# Patient Record
Sex: Male | Born: 1935 | Race: White | Hispanic: No | Marital: Married | Smoking: Never smoker
Health system: Southern US, Community
[De-identification: ages and names within clinical notes are randomized; demographics above are authoritative.]

## PROBLEM LIST (undated history)

## (undated) DIAGNOSIS — I2510 Atherosclerotic heart disease of native coronary artery without angina pectoris: Secondary | ICD-10-CM

## (undated) DIAGNOSIS — E785 Hyperlipidemia, unspecified: Secondary | ICD-10-CM

## (undated) DIAGNOSIS — I10 Essential (primary) hypertension: Secondary | ICD-10-CM

## (undated) DIAGNOSIS — E119 Type 2 diabetes mellitus without complications: Secondary | ICD-10-CM

## (undated) HISTORY — DX: Essential (primary) hypertension: I10

## (undated) HISTORY — DX: Hyperlipidemia, unspecified: E78.5

## (undated) HISTORY — DX: Type 2 diabetes mellitus without complications: E11.9

## (undated) HISTORY — PX: CORONARY ARTERY BYPASS GRAFT: SHX141

## (undated) HISTORY — PX: APPENDECTOMY: SHX54

## (undated) HISTORY — PX: TOTAL KNEE ARTHROPLASTY: SHX125

## (undated) HISTORY — DX: Atherosclerotic heart disease of native coronary artery without angina pectoris: I25.10

---

## 2004-01-17 ENCOUNTER — Inpatient Hospital Stay (HOSPITAL_COMMUNITY): Admission: RE | Admit: 2004-01-17 | Discharge: 2004-01-20 | Payer: Self-pay | Admitting: Orthopedic Surgery

## 2004-01-20 ENCOUNTER — Inpatient Hospital Stay
Admission: RE | Admit: 2004-01-20 | Discharge: 2004-01-24 | Payer: Self-pay | Admitting: Physical Medicine & Rehabilitation

## 2004-01-20 ENCOUNTER — Ambulatory Visit: Payer: Self-pay | Admitting: Physical Medicine & Rehabilitation

## 2005-03-21 ENCOUNTER — Other Ambulatory Visit: Admission: RE | Admit: 2005-03-21 | Discharge: 2005-03-21 | Payer: Self-pay | Admitting: Unknown Physician Specialty

## 2008-03-07 ENCOUNTER — Inpatient Hospital Stay (HOSPITAL_COMMUNITY): Admission: RE | Admit: 2008-03-07 | Discharge: 2008-03-10 | Payer: Self-pay | Admitting: Orthopedic Surgery

## 2008-05-09 ENCOUNTER — Ambulatory Visit (HOSPITAL_BASED_OUTPATIENT_CLINIC_OR_DEPARTMENT_OTHER): Admission: RE | Admit: 2008-05-09 | Discharge: 2008-05-09 | Payer: Self-pay | Admitting: Urology

## 2010-06-14 LAB — BASIC METABOLIC PANEL
BUN: 15 mg/dL (ref 6–23)
CO2: 27 mEq/L (ref 19–32)
Calcium: 8.7 mg/dL (ref 8.4–10.5)
Chloride: 104 mEq/L (ref 96–112)
Creatinine, Ser: 0.73 mg/dL (ref 0.4–1.5)
GFR calc Af Amer: >60 mL/min (ref >60)
GFR calc non Af Amer: >60 mL/min (ref >60)
Glucose, Bld: 193 mg/dL — ABNORMAL HIGH (ref 70–99)
Potassium: 4 mEq/L (ref 3.5–5.1)
Sodium: 136 mEq/L (ref 135–145)

## 2010-06-14 LAB — CBC
HCT: 37.3 % — ABNORMAL LOW (ref 39.0–52.0)
Hemoglobin: 12.4 g/dL — ABNORMAL LOW (ref 13.0–17.0)
MCHC: 33.1 g/dL (ref 30.0–36.0)
MCV: 89.4 fL (ref 78.0–100.0)
Platelets: 193 10*3/uL (ref 150–400)
RBC: 4.18 MIL/uL — ABNORMAL LOW (ref 4.22–5.81)
RDW: 15.7 % — ABNORMAL HIGH (ref 11.5–15.5)
WBC: 8 10*3/uL (ref 4.0–10.5)

## 2010-06-14 LAB — GLUCOSE, CAPILLARY: Glucose-Capillary: 175 mg/dL — ABNORMAL HIGH (ref 70–99)

## 2010-06-18 LAB — CBC
HCT: 33.9 % — ABNORMAL LOW (ref 39.0–52.0)
HCT: 36.5 % — ABNORMAL LOW (ref 39.0–52.0)
Hemoglobin: 11.5 g/dL — ABNORMAL LOW (ref 13.0–17.0)
Hemoglobin: 11.7 g/dL — ABNORMAL LOW (ref 13.0–17.0)
Hemoglobin: 11.9 g/dL — ABNORMAL LOW (ref 13.0–17.0)
MCHC: 32.7 g/dL (ref 30.0–36.0)
MCHC: 33.5 g/dL (ref 30.0–36.0)
MCHC: 33.9 g/dL (ref 30.0–36.0)
MCV: 89.6 fL (ref 78.0–100.0)
MCV: 90.1 fL (ref 78.0–100.0)
MCV: 91 fL (ref 78.0–100.0)
Platelets: 130 10*3/uL — ABNORMAL LOW (ref 150–400)
Platelets: 144 10*3/uL — ABNORMAL LOW (ref 150–400)
RBC: 3.77 MIL/uL — ABNORMAL LOW (ref 4.22–5.81)
RBC: 4.01 MIL/uL — ABNORMAL LOW (ref 4.22–5.81)
RDW: 15.3 % (ref 11.5–15.5)
RDW: 15.4 % (ref 11.5–15.5)
RDW: 16 % — ABNORMAL HIGH (ref 11.5–15.5)
WBC: 11.3 10*3/uL — ABNORMAL HIGH (ref 4.0–10.5)
WBC: 12.1 10*3/uL — ABNORMAL HIGH (ref 4.0–10.5)

## 2010-06-18 LAB — GLUCOSE, CAPILLARY
Glucose-Capillary: 168 mg/dL — ABNORMAL HIGH (ref 70–99)
Glucose-Capillary: 185 mg/dL — ABNORMAL HIGH (ref 70–99)
Glucose-Capillary: 211 mg/dL — ABNORMAL HIGH (ref 70–99)
Glucose-Capillary: 219 mg/dL — ABNORMAL HIGH (ref 70–99)
Glucose-Capillary: 235 mg/dL — ABNORMAL HIGH (ref 70–99)
Glucose-Capillary: 244 mg/dL — ABNORMAL HIGH (ref 70–99)
Glucose-Capillary: 245 mg/dL — ABNORMAL HIGH (ref 70–99)
Glucose-Capillary: 246 mg/dL — ABNORMAL HIGH (ref 70–99)
Glucose-Capillary: 250 mg/dL — ABNORMAL HIGH (ref 70–99)
Glucose-Capillary: 250 mg/dL — ABNORMAL HIGH (ref 70–99)
Glucose-Capillary: 287 mg/dL — ABNORMAL HIGH (ref 70–99)

## 2010-06-18 LAB — BASIC METABOLIC PANEL
BUN: 11 mg/dL (ref 6–23)
BUN: 11 mg/dL (ref 6–23)
BUN: 8 mg/dL (ref 6–23)
CO2: 27 mEq/L (ref 19–32)
CO2: 28 mEq/L (ref 19–32)
CO2: 32 mEq/L (ref 19–32)
Calcium: 7.8 mg/dL — ABNORMAL LOW (ref 8.4–10.5)
Calcium: 8 mg/dL — ABNORMAL LOW (ref 8.4–10.5)
Calcium: 8.2 mg/dL — ABNORMAL LOW (ref 8.4–10.5)
Chloride: 101 mEq/L (ref 96–112)
Chloride: 104 mEq/L (ref 96–112)
Chloride: 98 mEq/L (ref 96–112)
Creatinine, Ser: 0.69 mg/dL (ref 0.4–1.5)
Creatinine, Ser: 0.77 mg/dL (ref 0.4–1.5)
Creatinine, Ser: 0.82 mg/dL (ref 0.4–1.5)
GFR calc Af Amer: >60 mL/min (ref >60)
GFR calc Af Amer: >60 mL/min (ref >60)
GFR calc Af Amer: >60 mL/min (ref >60)
GFR calc non Af Amer: >60 mL/min (ref >60)
GFR calc non Af Amer: >60 mL/min (ref >60)
GFR calc non Af Amer: >60 mL/min (ref >60)
Glucose, Bld: 187 mg/dL — ABNORMAL HIGH (ref 70–99)
Glucose, Bld: 239 mg/dL — ABNORMAL HIGH (ref 70–99)
Glucose, Bld: 262 mg/dL — ABNORMAL HIGH (ref 70–99)
Potassium: 3.9 mEq/L (ref 3.5–5.1)
Potassium: 4 mEq/L (ref 3.5–5.1)
Potassium: 4.2 mEq/L (ref 3.5–5.1)
Sodium: 134 mEq/L — ABNORMAL LOW (ref 135–145)
Sodium: 136 mEq/L (ref 135–145)
Sodium: 138 mEq/L (ref 135–145)

## 2010-06-18 LAB — PROTIME-INR
INR: 1.1 (ref 0.00–1.49)
INR: 1.8 — ABNORMAL HIGH (ref 0.00–1.49)
INR: 1.8 — ABNORMAL HIGH (ref 0.00–1.49)
Prothrombin Time: 14.2 seconds (ref 11.6–15.2)
Prothrombin Time: 21.5 seconds — ABNORMAL HIGH (ref 11.6–15.2)

## 2010-07-17 NOTE — Op Note (Signed)
NAMEVERGIL, BURBY                 ACCOUNT NO.:  [id number]   MEDICAL RECORD NO.:  [id number]          PATIENT TYPE:  INP   LOCATION:  5030                         FACILITY:  [facility]   PHYSICIAN:  [name]. Ranell Patrick, M.D. DATE OF BIRTH:  Mar 05, 1934   DATE OF PROCEDURE:  DATE OF DISCHARGE:                               OPERATIVE REPORT   PREOPERATIVE DIAGNOSES:  Left knee end-stage osteoarthritis.   POSTOPERATIVE DIAGNOSES:  Left knee end-stage osteoarthritis.   PROCEDURE PERFORMED:  Left total knee replacement using DePuy Sigma  rotating platform prosthesis.   ATTENDING SURGEON:  [name]. Ranell Patrick, MD   ASSISTANT:  [name]. Dixon, PA   ANESTHESIA:  General anesthesia was used plus thermal block.   ESTIMATED BLOOD LOSS:  200 mL.   URINE OUTPUT:  500 mL.   FLUID REPLACEMENT:  2550 mL.   TOURNIQUET TIME:  2 hours and 30 minutes at 350 mmHg.   INSTRUMENT COUNT:  Correct.   COMPLICATIONS:  None.   Perioperative antibiotics were given.   INDICATIONS:  The patient is a 76 year old male with a history of  worsening arthritis in the left knee.  He has had prior right total knee  replacement and is done well over that total knee and desires total knee  replacement on the left.  The patient has radiographic findings  consistent with bone on bone arthritis and has failed conservative  management at this point.  The patient presents now for surgery.  Informed consent was obtained.   DESCRIPTION OF PROCEDURE:  After an adequate level of anesthesia was  achieved.  The patient was positioned supine on the operating table.  Nonsterile tourniquet was placed on the left proximal thigh.  The left  leg was sterilely prepped and draped in the usual manner.  After sterile  prep and drape we entered the knee with knee in flexion using a midline  incision using a 10-blade scalpel.  Dissection was carried sharply down  through subcutaneous tissues and medial parapatellar arthrotomy was  created.  Patella was everted.  Lateral patellar femoral ligament was  divided.  Distal femur was entered using a step-cut drill.  Distal  femoral resection set at 11 mm for 15 degrees of flexion contracture set  on 5 degrees left.  We performed out disk resection.  We then incised  the femur using an anterior down incisor incising to a 4.  We performed  our anterior and posterior and chamfer cuts using a four in one block.  Osteophytes were removed.  We then directed our attention towards the  tibia.  We subluxed the tibia anteriorly, removed ACL, PCL, and meniscal  tissue.  Performed a tibial cut 90 degrees perpendicular along the axis  of the tibia with minimal posterior slope using an external alignment  jig.  Next, we checked out gaps flexion and extension gaps were about 50  mm and symmetric.  We then went ahead and removed the tension in the  tibia.  We sized the tibia to size 4 and went ahead and performed our  rest of our tibial preparation with  a module of drill and keel punch. We  then placed a trial of tibial component in place.  We removed excess  posterior bone off the femur using half inch curved osteotome and the  lamina spreader.  Next, we went ahead and completed our femoral  preoperation with box cut guide and performed our cuts to accomodate the  posterior cruciate substituting prosthesis.  We then applied trial for  left femur component and then placed a 50 mm trial.  This seemed to be  just a tiny little bit loose.  We tried 17.5 which was perfect to get  full extension and nice soft tissue balancing in flexion and extension.  We then went ahead and resurfaced the patella starting at 30 mm  thickness, we resected down 60 mm thick patella and applied a 38  patellar button.  We drilled the legs for that, placed the button on,  and took the knee  through full range of motion, excellent soft tissue  balancing was noted.  At this point, we removed all trial components.  We  plugged in the femur and then cemented the components in place with  DePuy high-viscosity cement.  Once the cement was slightly hardened, we  removed excess cement using quarter inch curved osteotomes.  We trialed  again a 17.5.  We are happy with that soft tissue balancing and full  extension was obtained.  At this point, I noted that there was 0.5 mm  less gap on the lateral side of the femur both at the distal portion of  the implant at the implant cement interphase I could see the gap.  This  is also noted little bit on the posterior chamfer and anterior chamfer.  I was concerned about this gap creating an unsupported situation.  I  placed a little osteotome in there and levered on it and to me, I noted  there to be little bit of potential micromotion present.  At this point,  my decision is to remove this implant, but I did not feel was  appropriately fixed.  I used flexible osteotomes and went ahead and  developed the inter vel between the implant and the cement and we used  slap and removed the implant.  During the implant removal process there  was bone defect on the medial side of the medial femoral condyle.  This  was felt to be a fairly well contained defect, but was severe enough to  require 4 mm distal femoral augment.  We went ahead and drilled out the  femoral canal and reamed it, we did not drill it, but hand reamed the  femoral canal up to size 16.  We went ahead and decided on a cemented 13  mm x 30 mm stem and once we did that we trailed and were happy with that  trial.  This seemed to be well supported.  We then went ahead and  cemented this component place.  Again we used a size 4 left femur for  posterior cruciate substituting and then augmented with 4 mm distal  augment on the medial side and then used a 30 mm long stem.  This was  all cemented into place and held in position while that cement cure.  Excess cement removed appropriately.  We then placed a 17.5 insert  and  retrialed, more happy with that balance and full extension.  We then  placed a real 17.5 insert.  After re-inspecting the entire knee to make  sure  there is no excess cement fragments or residual cement from the  cementing the second time. Once we were satisfied that the knee was  cleaned, we then reduced the knee.  Again, we were happy with full  extension and excellent balancing and flexion and extension.  We then  closed the knee using #1 Vicryl suture in a figure-of-eight fashion for  the medial parapatellar arthrotomy and then closed the subcutaneous  tissues with 0 and 2-0 Vicryl followed by 4-0 Monocryl for skin.  Steri-  Strips applied followed by sterile compressive dressing.  The patient  tolerated the surgery well.      [name]. Ranell Patrick, M.D.  Electronically Signed     SRN/MEDQ  D:  03/07/2008  T:  03/08/2008  Job:  [id number]

## 2010-07-17 NOTE — H&P (Signed)
NAMEKORREY, SCHLEICHER                 ACCOUNT NO.:  [id number]   MEDICAL RECORD NO.:  [id number]          PATIENT TYPE:  INP   LOCATION:  NA                           FACILITY:  [facility]   PHYSICIAN:  [name]. Ranell Patrick, M.D. DATE OF BIRTH:  1934/07/11   DATE OF ADMISSION:  DATE OF DISCHARGE:                              HISTORY & PHYSICAL   CHIEF COMPLAINT:  Left knee pain.   HISTORY OF PRESENT ILLNESS:  The patient is a 76 year old male with  worsening left knee pain secondary to osteoarthritis.  This has been  refractory to conservative treatment.  The patient has elected to have a  left total knee arthroplasty by Dr. Malon Kindle.   PAST MEDICAL HISTORY:  1. Diabetes.  2. Coronary artery disease.  3. GERD.  4. Bypass surgery.  5. Hypertension.  6. Hyperlipidemia.   FAMILY MEDICAL HISTORY:  Coronary artery disease.   SOCIAL HISTORY:  Patient of Dr. Margo Common, also Dr. Earna Coder is his  cardiologist in [place].  Does not smoke or use alcohol.  The patient is  married.   DRUG ALLERGIES:  None.   CURRENT MEDICATIONS:  1. Vytorin 10/80 mg p.o. daily.  2. Klor-Con 20 mg p.o. daily.  3. Metoprolol 50 mg b.i.d.  4. Januvia 100 mg p.o. daily.  5. Ramipril 5 mg p.o. daily.  6. Aspirin 325 mg p.o. daily.  7. Lasix 20 mg p.o. daily.  8. Amaryl 4 mg p.o. daily.   REVIEW OF SYSTEMS:  He has pain with ambulation.   PHYSICAL EXAMINATION:  VITAL SIGNS:  Pulse 68, respirations 16, and  blood pressure 122/78.  GENERAL:  The patient is a healthy-appearing 76 year old male in no  acute distress.  Pleasant mood and affect.  Alert and oriented x3.  HEAD AND NECK:  Cranial nerves II through XII grossly intact.  Neck  shows full range of motion without any tenderness.  CHEST:  He has active breath sounds bilaterally with no wheezes,  rhonchi, or rales.  HEART:  2/6 early systolic murmur.  ABDOMEN:  Nontender and nondistended.  EXTREMITIES:  He has moderate tenderness to left knee and crepitus on  range of motion.  He does have some mild pedal edema bilateral lower  extremities.  NEUROLOGIC:  He is intact distally.  He does have a normal heel-toe  gait.  No rashes.   X-rays show end-stage osteoarthritis to the left knee.   IMPRESSION:  End-stage osteoarthritis, left knee.   PLAN OF ACTION:  To have a left total knee arthroplasty by Dr. Malon Kindle.      Thomas B. Durwin Nora, P.A.      [name]. Ranell Patrick, M.D.  Electronically Signed    TBD/MEDQ  D:  02/23/2008  T:  02/23/2008  Job:  [id number]

## 2010-07-17 NOTE — Op Note (Signed)
NAMEKENZEL, RUESCH                 ACCOUNT NO.:  [id number]   MEDICAL RECORD NO.:  [id number]          PATIENT TYPE:  AMB   LOCATION:  NESC                         FACILITY:  [facility]   PHYSICIAN:  Bertram Millard. Dahlstedt, M.D.DATE OF BIRTH:  [birth year]   DATE OF PROCEDURE:  05/09/2008  DATE OF DISCHARGE:                               OPERATIVE REPORT   PREOPERATIVE DIAGNOSIS:  History of gross hematuria with left flank  pain, possible filling defect of left lower pole calix.   POSTOPERATIVE DIAGNOSIS:  History of gross hematuria with left flank  pain, possible filling defect of left lower pole calix.   PROCEDURE:  Cystoscopy, attempted left retrograde ureteral pyelogram.   SURGEON:  Bertram Millard. Dahlstedt, M.D.   ANESTHESIA:  General with LMA.   COMPLICATIONS:  None.   BRIEF HISTORY:  This 76 year old male presented a few weeks ago with  significant left flank pain and gross hematuria.  His INR was way out  from Coumadin.  CT was done due to significant gross hematuria and flank  pain.  This revealed edema of the left mid to distal ureter, significant  filling defect in the left renal pelvis secondary to probable blood, and  possible filling defect in the lower pole calix.  The patient was  removed from his Coumadin.  At this point, he has had no gross hematuria  or flank pain.  He passed a small calculus.  Due to the presence of  possible caliceal abnormality in the left lower pole, it was recommended  that he undergo cysto and left retrograde.  Risks and complications of  procedure have been discussed with the patient.  He understands these  and desires to proceed.   DESCRIPTION OF PROCEDURE:  The patient received preoperative IV  antibiotics, surgical side was marked and he was taken to the operating  room where general anesthetic was administered using LMA.  He was placed  in the dorsal lithotomy position.  Genitalia and perineum were prepped  and draped.  A 22 French  panendoscope was passed under direct vision  through his urethra.  There was mild meatal stenosis.  There is also a  very mild stricture located near the bulbous urethra.  This was  navigated with the beak of the scope.  The bladder was entered and  inspected circumferentially.  Prostatic urethra was minimally  obstructed.  It was it was elongated.  The bladder was displayed mild  trabeculations.  No significant lesions were seen.  There was a  significant median lobe.  Identification of the right ureteral orifice  was quite easy.  On the left, I was unable to visualize the right  ureteral orifice.  I used both the 70 and 12 degrees lenses.  The  patient was given indigo carmine.  There was adequate indigo carmine  coming from the right orifice.  However, due to the extended cystoscopy,  and the increased vascularity of his bladder neck and prostatic urethra,  he had significant amount of blood which obscured adequate visualization  of the left ureteral orifice.  It was obviously ectopic.  I  attempted  probing the area of the expected ureteral orifice with an open-end  catheter and a guidewire.  However, I was unable to navigate anything  into what would be the ureteral orifice.  After approximately 20-25  minutes of attempting this, I aborted the procedure, as I felt that this  continued, he would have more significant hematuria.  At this point the  bladder was drained and the scope removed.   The patient tolerated procedure well.  He was awakened and taken to PACU  in stable condition.   The patient was discharged on hydrocodone/APAP 1 p.o. q.4 h. p.r.n. pain  and cephalexin 500 mg 1 p.o. t.i.d. for 3 days.  He will follow up at  the end months, and we will perform an IVP to further evaluate his upper  tracts.      Bertram Millard. Dahlstedt, M.D.  Electronically Signed     SMD/MEDQ  D:  05/09/2008  T:  05/09/2008  Job:  [id number]   cc:   Wyvonnia Lora  Fax: [phone]

## 2010-07-17 NOTE — Discharge Summary (Signed)
[name], MARVIN                 ACCOUNT NO.:  [id number]   MEDICAL RECORD NO.:  [id number]          PATIENT TYPE:  INP   LOCATION:  5030                         FACILITY:  [facility]   PHYSICIAN:  [name]. Ranell Patrick, M.D. DATE OF BIRTH:  1934/03/09   DATE OF ADMISSION:  03/07/2008  DATE OF DISCHARGE:  03/10/2008                               DISCHARGE SUMMARY   ADMISSION DIAGNOSIS:  Left knee end-stage osteoarthritis.   DISCHARGE DIAGNOSIS:  Left knee end-stage osteoarthritis.   BRIEF HISTORY:  The patient is a 76 year old male comes in complaining  about left knee pain with worsening symptoms.  He has elected to have a  left total knee arthroplasty by Dr. Malon Kindle.   PROCEDURE:  The patient had left total knee arthroplasty by Dr. Malon Kindle on March 07, 2008.  Assistant was [employer]. Dixon, [place].  General  anesthesia was used.  Estimated blood loss was 200 mL.  Fluid  replacement was 2.5 liters.  Tourniquet time was 2-1/2 hours.   HOSPITAL COURSE:  The patient was admitted on March 07, 2008, for the  above-stated procedure which he tolerated well and after adequate time  in post anesthesia care unit, he was transferred up to 2000.  Postop day  #1, the patient complained of moderate pain to the left knee, but was  able to work with physical therapy and use his CPM.  His labs were  within acceptable limits except his glucose was elevated.  Postop day  #2, the patient was doing much better.  It was noted that he had a small  little boil or raised rash to the posterior calf of his operative leg  and this patient was placed on antibiotics after surgery and also to be  sent home with, but he worked with physical therapy quite well.  His INR  was 1.8 on postop day #3, on date of discharge.  He was discharged home  with home health physical therapy and discharge planning.   CONDITION:  Stable.   DIET:  Diabetic.   ALLERGIES:  The patient has no known drug allergies.   DISCHARGE MEDICATIONS:  1. Aspirin 325 mg p.o. daily.  2. Keflex 500 mg p.o. t.i.d.  3. Vytorin 1 tab p.o. daily.  4. Lasix 20 mg p.o. daily.  5. Amaryl 4 mg p.o. daily.  6. Insulin 1-15 units subcu t.i.d. with food.  7. Robaxin 500 mg p.o. q.6 h.  8. Lopressor 40 mg p.o. daily.  9. Toprol 50 mg p.o. daily.  10.Protonix 40 mg b.i.d.  11.Altace 5 mg nightly.  12.Januvia 100 mg p.o. daily.  13.Coumadin per pharmacy protocol.  14.Percocet 5/325 one-two tabs q.4-6 h. p.r.n. pain.   FOLLOWUP:  The patient will follow up with Dr. Malon Kindle in 2 weeks.      Thomas B. Durwin Nora, P.A.      [name]. Ranell Patrick, M.D.  Electronically Signed    TBD/MEDQ  D:  03/10/2008  T:  03/10/2008  Job:  [id number]

## 2010-07-20 NOTE — H&P (Signed)
NAMEWENDALL, [name]                 ACCOUNT NO.:  [id number]   MEDICAL RECORD NO.:  [id number]          PATIENT TYPE:   LOCATION:                                 FACILITY:   PHYSICIAN:  [name]. Ranell Patrick, M.D. DATE OF BIRTH:  [birth year]   DATE OF ADMISSION:  DATE OF DISCHARGE:                                HISTORY & PHYSICAL   Mr. [name] is a 76 year old male who comes in complaining of right knee pain.   HISTORY OF PRESENT ILLNESS:  Patient has had right knee pain for many years.  It has been refractory to conservative treatment and has elected to have a  right total knee arthroplasty to be completed by Dr. Malon Kindle.   ALLERGIES:  None.   MEDICATIONS:  1.  Lasix 20 mg daily.  2.  Vytorin 10/80 mg daily.  3.  Altace 5 mg daily.  4.  Aspirin 325 mg daily.  5.  Avandia 8 mg daily.  6.  Amaryl 4 mg daily.  7.  Potassium 20 mEq daily.  8.  Metoprolol 50 mg daily.   PAST MEDICAL HISTORY:  1.  Diabetes.  2.  Hypertension.  3.  Hyperlipidemia.  4.  GERD.   SOCIAL HISTORY:  Patient of Dr. Wyvonnia Lora in [place] and also Dr. Earna Coder is  his cardiologist.  He is married and does not smoke.   FAMILY HISTORY:  Coronary artery disease and hypertension.   REVIEW OF SYSTEMS:  Negative except for painful ambulation.   PHYSICAL EXAMINATION:  VITAL SIGNS:  Pulse 76, respirations 18, blood  pressure 126/72.  GENERAL:  Patient is a healthy 76 year old male in no acute distress.  Pleasant mood and affect.  Alert and oriented x3.  HEENT/NECK:  Examination of head and neck shows cranial nerves II-XII  grossly intact.  He has full range of motion of the cervical spine with no  tenderness on palpation of the paraspinal muscles and spinous processes.  Spurling test negative x2.  CHEST:  Active breath sounds in all lung fields.  No wheezes, rales or  rhonchi.  HEART:  Regular rate and rhythm.  No murmur.  ABDOMEN:  Active bowel sounds in all quadrants.  Nontender.  Nondistended.  EXTREMITIES:  Examination of the bilateral upper extremities shows full  range of motion.  Sensation is grossly intact.  No gross deformity.  Moderate crepitus and tenderness to bilateral knees.  He does have some  moderate edema bilaterally pedally.  SKIN:  No rashes.  Reflexes are 2+ and equal bilaterally.   X-rays show right knee osteoarthritis, which is severe.   IMPRESSION:  Right knee osteoarthritis, severe.   PLAN:  Right total knee arthroplasty to be completed by Dr. Malon Kindle on  January 17, 2004.      ________________________________________  [name]. Dixon, P.A.  ___________________________________________  [name]. Ranell Patrick, M.D.    TBD/MEDQ  D:  01/04/2004  T:  01/04/2004  Job:  [id number]

## 2010-07-20 NOTE — Discharge Summary (Signed)
[name], [name]                 ACCOUNT NO.:  [id number]   MEDICAL RECORD NO.:  [id number]          PATIENT TYPE:  ORB   LOCATION:  4502                         FACILITY:  [facility]   PHYSICIAN:  Ranelle Oyster, M.D.DATE OF BIRTH:  1934-05-15   DATE OF ADMISSION:  01/20/2004  DATE OF DISCHARGE:                                 DISCHARGE SUMMARY   DISCHARGE DIAGNOSES:  1.  Right total knee arthroplasty secondary to degenerative joint disease on      November 15, pain management.  2.  Coumadin for deep venous thrombosis prophylaxis.  3.  Anemia.  4.  Hypertension.  5.  Coronary artery disease with coronary artery bypass grafting.  6.  Non-insulin dependent diabetes.  7.  Hyperlipidemia.   HISTORY OF PRESENT ILLNESS:  This is a 76 year old white male admitted to  [facility] on November 15 with end stage changes of the right knee  and no relief with conservative care.  He underwent an elective right total  knee arthroplasty on November 15 per Dr. Ranell Patrick.  He was placed on Coumadin  for deep venous thrombosis prophylaxis and weight bearing as tolerated.  His  hospital course was uneventful with no chest pain, nausea or vomiting. He  was admitted for comprehensive rehab program.   PAST MEDICAL HISTORY:  See discharge diagnoses.   ALLERGIES:  None.   SOCIAL HISTORY:  He lives with his wife in [place] in a one level home with  four steps to entry.  His wife works second shift.  The patient is retired.   MEDICATIONS PRIOR TO ADMISSION:  1.  Lasix.  2.  Vytorin.  3.  Altace.  4.  Aspirin.  5.  Avandia.  6.  Amaryl.  7.  Potassium chloride.  8.  Lopressor.   HOSPITAL COURSE:  The patient with progressive gains while on rehab services  with therapies initiated on a daily basis.  The following issues were  following during the patient's rehab course.  Pertaining to the patient's  right total knee arthroplasty, the surgical site is healing nicely.  Staples  remained  intact.  He was ambulating supervision with a walker.  Arrangements  were to be made for outpatient therapies as advised. He remained on  OxyContin sustained release 10 mg every 12 hours for pain as well as  Oxycodone for breakthrough pain and Coumadin for deep venous thrombosis  prophylaxis with latest INR of 2.0.  Dr. Wyvonnia Lora, [phone], is to  follow until Coumadin is completed.  Blood pressures remained monitored  without orthostatic changes on Lopressor and Altace as well as Lasix 20 mg  daily with no signs of fluid overload.  Postoperative anemia was stable with  latest hemoglobin of 11.7 and hematocrit of 33.9.  Blood sugars maintained  at 144, 172 and 139 on home regimen of Avandia and Amaryl as well as a  diabetic diet.  He had no bowel or bladder disturbances.  He would be  discharged to home with outpatient therapy at [facility].   DISCHARGE MEDICATIONS:  1.  Coumadin with latest dose of  2.5 mg adjusted accordingly to be completed      on February 16, 2004.  2.  Prilosec 20 mg daily.  3.  Lopressor 50 mg twice daily.  4.  Potassium chloride 20 mEq daily.  5.  Altace 5 mg daily.  6.  Avandia 8 mg daily.  7.  Vytorin one tablet daily.  8.  Lasix 20 mg daily.  9.  Amaryl 4 mg daily.  10. OxyContin sustained release 10 mg every 12 hours times one week and then      discontinue.  11. Oxycodone immediate release as needed for breakthrough pain.   ACTIVITY:  As tolerated.   DIET:  Diabetic diet.   FOLLOW UP:  1.  Follow-up with Dr. Ranell Patrick at [phone] in one week for removal of      staples.  Call for any increased redness, drainage or fever.  2.  Follow-up with Dr. Margo Common of [place], [place] for necessary Coumadin      therapy until completed and then resume aspirin as prior to admission.   SPECIAL INSTRUCTIONS:  No aspirin or ibuprofen while on Coumadin.       DA/MEDQ  D:  01/23/2004  T:  01/23/2004  Job:  [id number]   cc:   [name]. Ranell Patrick, M.D.   [facility]  [address]  [address]  [place]  [place] [address]  Fax: [phone]   Wyvonnia Lora  [address]  [place]  [place] [address]  Fax: [phone]

## 2010-07-20 NOTE — Op Note (Signed)
[name], [name]                 ACCOUNT NO.:  [id number]   MEDICAL RECORD NO.:  [id number]          PATIENT TYPE:  INP   LOCATION:  0003                         FACILITY:  [facility]   PHYSICIAN:  [name]. Ranell Patrick, M.D. DATE OF BIRTH:  02-Jun-1934   DATE OF PROCEDURE:  01/17/2004  DATE OF DISCHARGE:                                 OPERATIVE REPORT   PREOPERATIVE DIAGNOSIS:  Right knee osteoarthritis.   POSTOPERATIVE DIAGNOSIS:  Right knee osteoarthritis.   PROCEDURE PERFORMED:  Right knee total knee replacement, utilizing DePuy  Sigma rotating platform prosthesis.   ATTENDING SURGEON:  [name]. Ranell Patrick, MD   ASSISTANT:  Standley Dakins, PA-C   ANESTHESIA:  Spinal anesthesia was initiated prior general anesthesia.  This  was felt to be spotty, thus the need for general anesthesia.   ESTIMATED BLOOD LOSS:  Minimal.   TOURNIQUET TIME:  2 hours 10 minutes.   INSTRUMENT COUNT:  Correct.   COMPLICATIONS:  None.   FLUID REPLACEMENT:  2100 mL crystalloid.   URINE OUTPUT:  300 mL.   INDICATIONS:  The patient is a 76 year old male with end-stage right knee  arthritis.  The patient has failed conservative management consisting of  activity modifications, injections therapy, and presents now for  arthroplasty of his knee on the right.  The patient has bilateral knee  disease with varus angulation and complete joint space obliteration,  tricompartmental disease.  Informed consent was obtained.   DESCRIPTION OF PROCEDURE:  After an adequate level of anesthesia was  achieved, the patient was positioned supine on the operating room table.  Nonsterile tourniquet was placed on the right proximal thigh.  The right leg  was sterilely prepped and draped in the usual manner, the left padded  appropriately and secured to the operating room table.  The leg was  exsanguinated using Esmarch bandage.  The knee was flexed and the tourniquet  elevated to 325 mmHg.  A longitudinal skin incision was  created, centered on  the patella and extended to the medial side of the tibial tubercle.  Dissection was carried sharply down through subcutaneous tissues, medial  parapatellar arthrotomy created exposing the knee joint.  Obvious end-stage  arthritis with enervated bone on the patellofemoral joint, trochlea, and the  medial compartment.  The patellofemoral ligaments were loosened using Bovie,  and then we flipped the patella to reflex the knee, exposing the ACL and the  menisci.  These were removed sharply using Bovie electrocautery.  At this  point, the distal femur was entered with a step-cut drill, providing access  to the femoral intramedullary canal.  We placed an intermedullary guide for  a distal femoral resection, 5 mm right selected.  This was cut using  oscillating saw with 11 mm of resection.  The patient had a 5-degree flexion  contracture.  After resecting 11 mm of distal femur, we then sized the femur  as a size 4 for a Sigma system, then placed the four-in-one cutting guide on  the distal femur with attention towards appropriate external rotation of the  component.  We referenced off the  epicondylar axis.  We then performed both  the anterior and posterior cuts, protecting the soft tissues as well as the  chamfer cuts.  We then subluxed the tibia forward, removed the PCL off the  tibia getting good access to the upper portion of the tibia and then using  external alignment guide, performed a 90-degree perpendicular cut, taking 6  mm off the affected medial side, placed about 3 degrees of posterior slope  on this and, again, using oscillating saw.  Then trialed with some extension  and flexion block trials which gave [place] outstanding soft tissue balance and  gaps in both flexion and extension.  We removed the trial components.  We  then went back up to the femur and performed the box cut using the box cut  guide, and we translated this a little bit laterally to assist in our   tracking which was still good coverage.  We then performed the tibial sizing  which was a size 5, and this covered the tibial plateau nicely and was  anchored in place with some pins.  We then performed the keel punch and  drilled out the well for the recess portion of the tibial component, again  with care taken towards providing maximum coverage for the component.  Once  this was done, we left the trial in place, placed the trial femur on, and  then trialed with the 10 mm insert.  Perfect soft tissue balance was noted  in extension and flexion.  We then performed a resurfacing of the patella  utilizing free hand technique, removing down to 14 mm of patellar thickness  and then drilling for a 41 patella.  We then placed the patella component,  ranged the knee through a full range of motion.  No instability was noted  with the no-touch technique.  Removed the trial components, thoroughly  irrigated, looked for any excess bone or remaining soft tissue that might  interpose.  We removed that sharply using the Bovie and then using third  generation vacuum mixing technique, cemented the components in place, tibia  first then femur, followed by removal of excess cement, placement of a 10 mm  insert, placed the knee in extension axial load, and then cemented the  patella with a compressive clamp.  After the cement was allowed to harden  for the appropriate amount of time, we then ranged the knee.  Excellent  balance in full extension was obtained.  Thus, we selected a 10-degree  component, removed the trial, inserted the real, thoroughly irrigated the  knee, inspected for any loose debris or cement, and then closed over a  Hemovac drain.  The medial patellar and parapatellar arthrotomy with  interrupted #1 PDS suture followed by placement of a Stryker pain pump in  the subcutaneous tissues superficial to the parapatellar arthrotomy and then closed with 0 Vicryl, deep subcutaneous 2-0 Vicryl  superficial subcutaneous  tissue and staples for the skin.  Sterile compressive dressing, knee  immobilizer, __________ therapy unit applied.  The patient taken to recovery  room in stable condition.      SRN/MEDQ  D:  01/17/2004  T:  01/17/2004  Job:  [id number]

## 2010-10-07 IMAGING — CR DG KNEE 1-2V PORT*L*
2 series · 2 of 2 positions shown · non-contrast
Comparison: None.

CLINICAL DATA: Postop left total knee arthroplasty.

PORTABLE LEFT KNEE - 1-2 VIEW [DATE]/[id number] hours:

[view not recorded (1 of 2)]
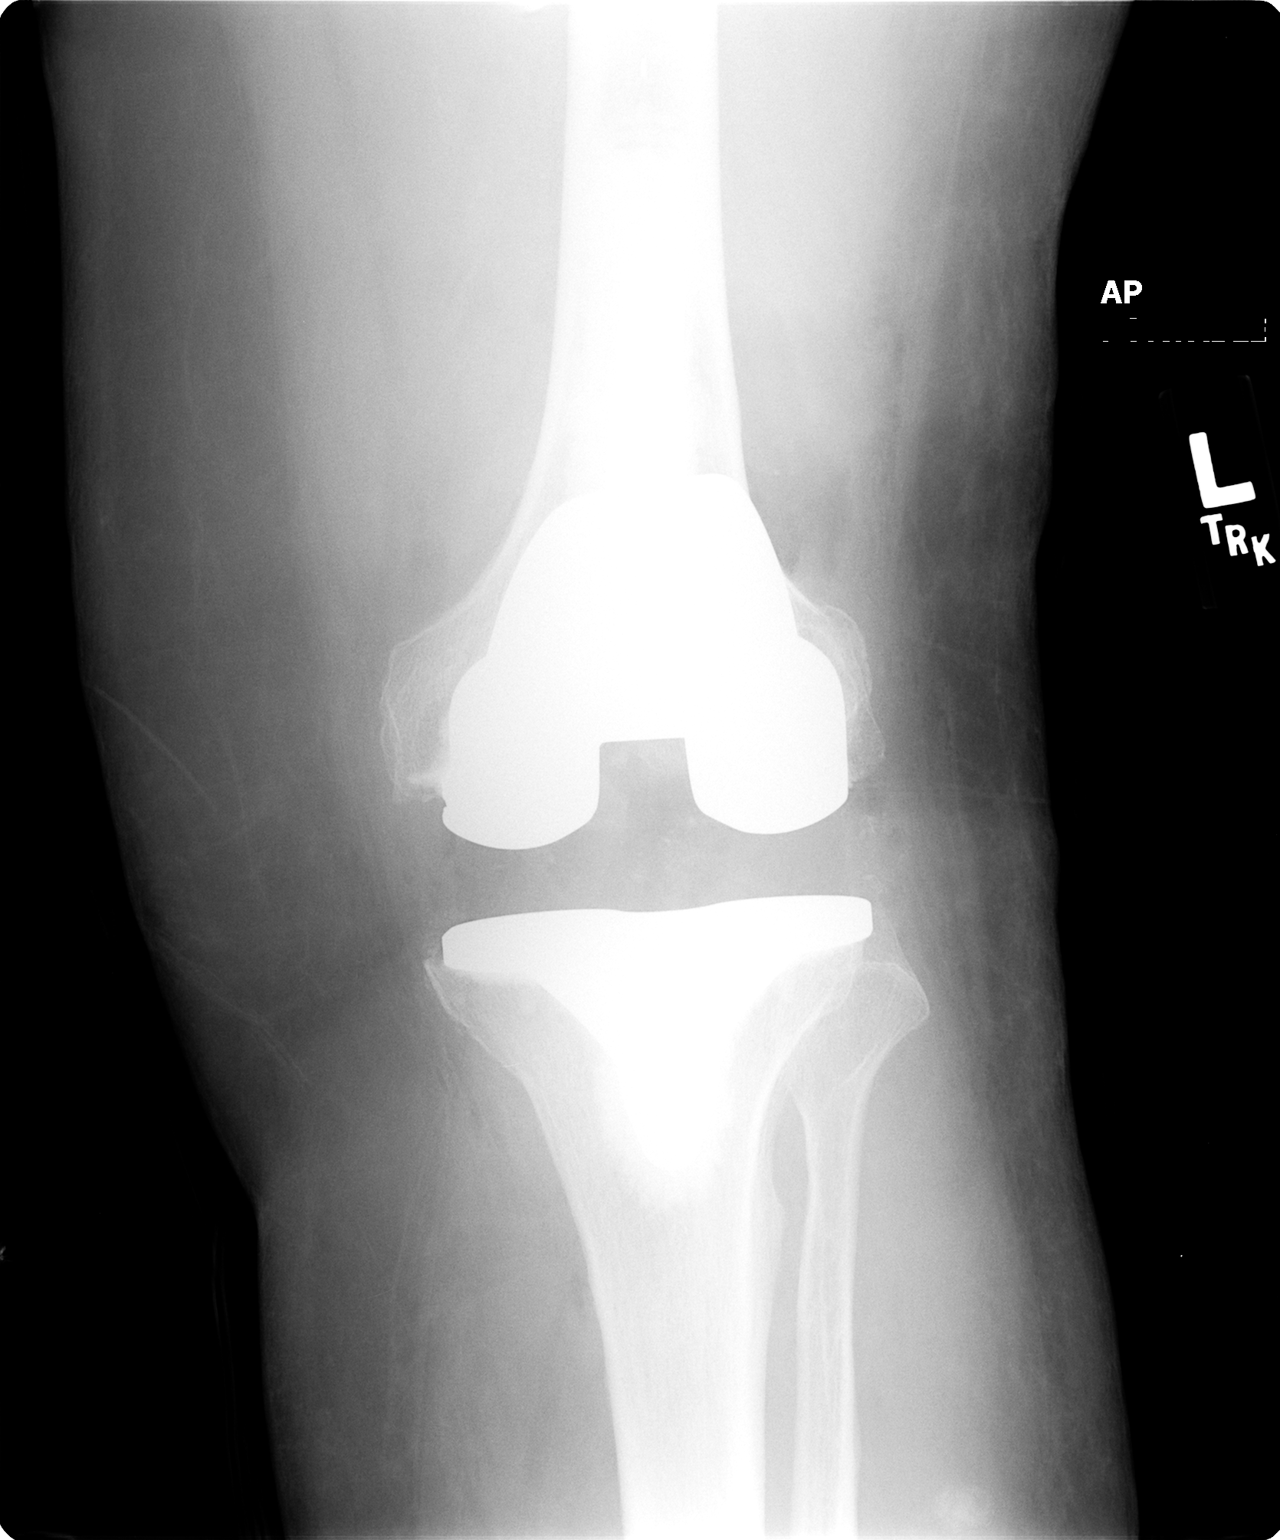

[view not recorded (2 of 2)]
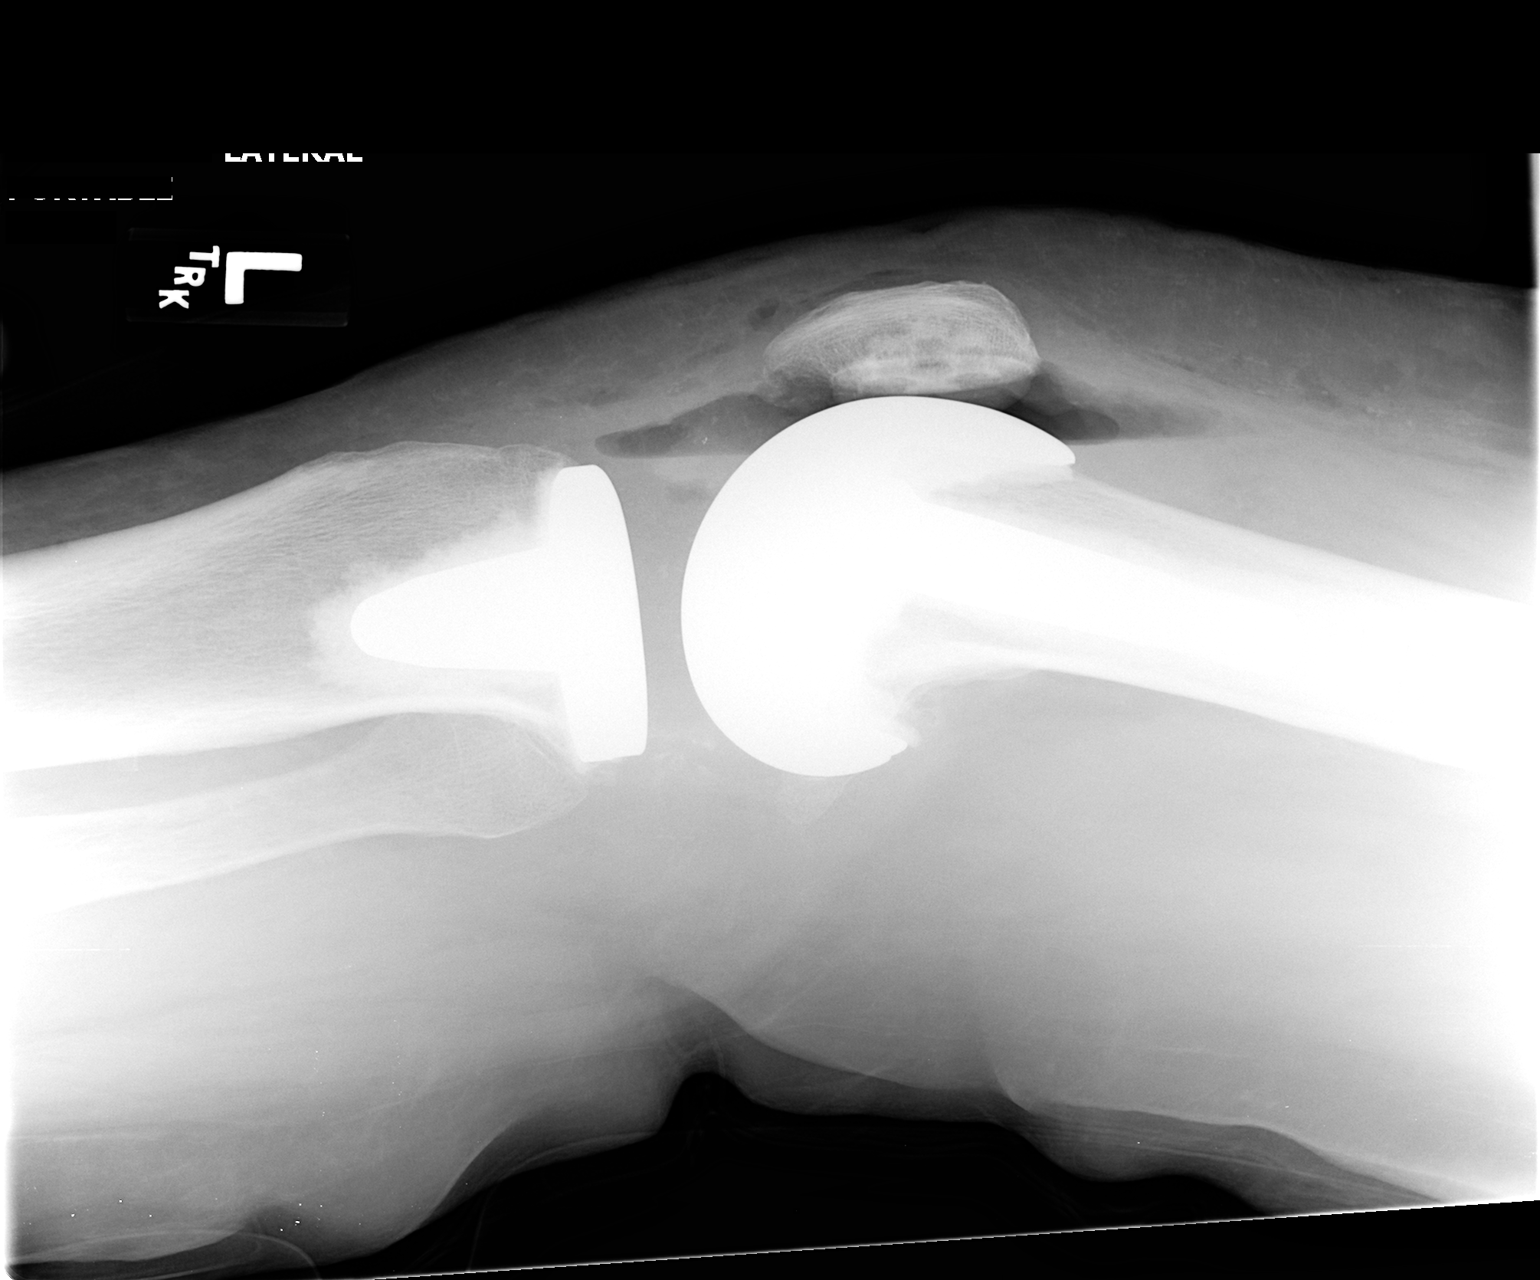

[2 of 2 positions shown; findings below may reference images not displayed]

FINDINGS: Left total knee arthroplasty with anatomic alignment.  No
acute complicating features.  Air-fluid level in the joint space,
as expected.
IMPRESSION: Anatomic alignment status post left total knee arthroplasty without
acute complicating features.

## 2010-12-07 LAB — CBC
HCT: 43.5 % (ref 39.0–52.0)
Hemoglobin: 14.5 g/dL (ref 13.0–17.0)
MCHC: 33.2 g/dL (ref 30.0–36.0)
MCV: 90.2 fL (ref 78.0–100.0)
RBC: 4.82 MIL/uL (ref 4.22–5.81)
RDW: 15 % (ref 11.5–15.5)

## 2010-12-07 LAB — BASIC METABOLIC PANEL
CO2: 28 mEq/L (ref 19–32)
Calcium: 9 mg/dL (ref 8.4–10.5)
Chloride: 103 mEq/L (ref 96–112)
GFR calc Af Amer: >60 mL/min (ref >60)
Glucose, Bld: 224 mg/dL — ABNORMAL HIGH (ref 70–99)
Potassium: 4.2 mEq/L (ref 3.5–5.1)
Sodium: 139 mEq/L (ref 135–145)

## 2010-12-07 LAB — DIFFERENTIAL
Basophils Absolute: 0 10*3/uL (ref 0.0–0.1)
Basophils Relative: 0 % (ref 0–1)
Eosinophils Absolute: 0.1 10*3/uL (ref 0.0–0.7)
Eosinophils Relative: 1 % (ref 0–5)
Monocytes Absolute: 0.6 10*3/uL (ref 0.1–1.0)
Monocytes Relative: 7 % (ref 3–12)

## 2010-12-07 LAB — URINALYSIS, ROUTINE W REFLEX MICROSCOPIC
Bilirubin Urine: NEGATIVE
Glucose, UA: NEGATIVE mg/dL
Ketones, ur: NEGATIVE mg/dL
Nitrite: NEGATIVE
Specific Gravity, Urine: 1.014 (ref 1.005–1.030)
pH: 5.5 (ref 5.0–8.0)

## 2010-12-07 LAB — ABO/RH: ABO/RH(D): O POS

## 2010-12-07 LAB — TYPE AND SCREEN: Antibody Screen: NEGATIVE

## 2010-12-07 LAB — APTT: aPTT: 28 seconds (ref 24–37)

## 2017-03-31 DIAGNOSIS — E119 Type 2 diabetes mellitus without complications: Secondary | ICD-10-CM | POA: Insufficient documentation

## 2017-03-31 DIAGNOSIS — Z961 Presence of intraocular lens: Secondary | ICD-10-CM | POA: Insufficient documentation

## 2017-03-31 DIAGNOSIS — H2511 Age-related nuclear cataract, right eye: Secondary | ICD-10-CM | POA: Insufficient documentation

## 2017-03-31 DIAGNOSIS — H43812 Vitreous degeneration, left eye: Secondary | ICD-10-CM | POA: Insufficient documentation

## 2017-03-31 DIAGNOSIS — H35071 Retinal telangiectasis, right eye: Secondary | ICD-10-CM | POA: Insufficient documentation

## 2017-03-31 DIAGNOSIS — H353111 Nonexudative age-related macular degeneration, right eye, early dry stage: Secondary | ICD-10-CM | POA: Insufficient documentation

## 2017-03-31 DIAGNOSIS — H35352 Cystoid macular degeneration, left eye: Secondary | ICD-10-CM | POA: Insufficient documentation

## 2017-06-16 DIAGNOSIS — M79641 Pain in right hand: Secondary | ICD-10-CM | POA: Insufficient documentation

## 2017-06-30 DIAGNOSIS — M659 Synovitis and tenosynovitis, unspecified: Secondary | ICD-10-CM | POA: Insufficient documentation

## 2018-02-19 DIAGNOSIS — E11311 Type 2 diabetes mellitus with unspecified diabetic retinopathy with macular edema: Secondary | ICD-10-CM | POA: Insufficient documentation

## 2018-04-25 ENCOUNTER — Encounter: Payer: Self-pay | Admitting: Podiatry

## 2018-04-25 ENCOUNTER — Ambulatory Visit: Payer: Medicare PPO | Admitting: Podiatry

## 2018-04-25 ENCOUNTER — Other Ambulatory Visit: Payer: Self-pay

## 2018-04-25 DIAGNOSIS — M1990 Unspecified osteoarthritis, unspecified site: Secondary | ICD-10-CM | POA: Insufficient documentation

## 2018-04-25 DIAGNOSIS — M79675 Pain in left toe(s): Secondary | ICD-10-CM

## 2018-04-25 DIAGNOSIS — M79674 Pain in right toe(s): Secondary | ICD-10-CM

## 2018-04-25 DIAGNOSIS — Q828 Other specified congenital malformations of skin: Secondary | ICD-10-CM | POA: Diagnosis not present

## 2018-04-25 DIAGNOSIS — I10 Essential (primary) hypertension: Secondary | ICD-10-CM | POA: Insufficient documentation

## 2018-04-25 DIAGNOSIS — E1159 Type 2 diabetes mellitus with other circulatory complications: Secondary | ICD-10-CM

## 2018-04-25 DIAGNOSIS — B351 Tinea unguium: Secondary | ICD-10-CM | POA: Diagnosis not present

## 2018-04-25 DIAGNOSIS — I2510 Atherosclerotic heart disease of native coronary artery without angina pectoris: Secondary | ICD-10-CM | POA: Insufficient documentation

## 2018-04-25 NOTE — Progress Notes (Signed)
This patient presents to the office with chief complaint of long thick nails and diabetic feet.  This patient  says there  is  no pain and discomfort in his  feet.  This patient says there are long thick painful nails.  These nails are painful walking and wearing shoes.  Patient has no history of infection or drainage from both feet.  Patient is unable to  self treat his own nails . This patient presents  to the office today for treatment of the  long nails and a foot evaluation due to history of  Diabetes.  Patient has been diabetic for over 20 years.  General Appearance  Alert, conversant and in no acute stress.  Vascular  Dorsalis pedis  are palpable weakly  Bilaterally. Posterior tibial pulses are absent  B/L.  Capillary return is within normal limits  bilaterally. Temperature is within normal limits  bilaterally.  Neurologic  Senn-Weinstein monofilament wire test within normal limits  bilaterally. Muscle power within normal limits bilaterally.  Nails Thick disfigured discolored nails with subungual debris  from hallux to fifth toes bilaterally. No evidence of bacterial infection or drainage bilaterally.  Orthopedic  No limitations of motion of motion feet .  No crepitus or effusions noted.  No bony pathology or digital deformities noted.  Skin  normotropic skin  noted bilaterally.  No signs of infections or ulcers noted.   Asymptomatic porokeratosis left foot.  Onychomycosis  Diabetes with no foot complications  IE  Debride nails x 10.  A diabetic foot exam was performed and there is no evidence of any  neurologic pathology. Vascular pathology needs to be evaluated.  RTC 3 months.   Helane Gunther DPM

## 2018-07-22 ENCOUNTER — Ambulatory Visit: Payer: Medicare PPO | Admitting: Podiatry

## 2020-03-28 ENCOUNTER — Ambulatory Visit: Payer: Medicare PPO | Admitting: Neurology

## 2022-01-04 ENCOUNTER — Ambulatory Visit: Payer: Medicare PPO | Admitting: Urology

## 2022-01-04 ENCOUNTER — Encounter: Payer: Self-pay | Admitting: Urology

## 2022-01-04 VITALS — BP 151/66 | HR 67 | Ht 66.0 in | Wt 245.0 lb

## 2022-01-04 DIAGNOSIS — N210 Calculus in bladder: Secondary | ICD-10-CM | POA: Diagnosis not present

## 2022-01-04 DIAGNOSIS — R310 Gross hematuria: Secondary | ICD-10-CM | POA: Diagnosis not present

## 2022-01-04 DIAGNOSIS — Z87440 Personal history of urinary (tract) infections: Secondary | ICD-10-CM | POA: Diagnosis not present

## 2022-01-04 LAB — URINALYSIS, ROUTINE W REFLEX MICROSCOPIC
Bilirubin, UA: NEGATIVE
Glucose, UA: NEGATIVE
Ketones, UA: NEGATIVE
Leukocytes,UA: NEGATIVE
Nitrite, UA: NEGATIVE
Protein,UA: NEGATIVE
RBC, UA: NEGATIVE
Specific Gravity, UA: 1.01 (ref 1.005–1.030)
Urobilinogen, Ur: 0.2 mg/dL (ref 0.2–1.0)
pH, UA: 5 (ref 5.0–7.5)

## 2022-01-04 LAB — BLADDER SCAN AMB NON-IMAGING: Scan Result: 82

## 2022-01-04 NOTE — Progress Notes (Signed)
post void residual =82mL 

## 2022-01-04 NOTE — Progress Notes (Signed)
Assessment: 1. Bladder calculi   2. History of UTI   3. Gross hematuria     Plan: I personally reviewed the patient records from his ER visit on 10/27/2021 include pain provider notes, lab results, and imaging results. I personally reviewed the CT study from 10/27/2021 with results as noted below. I discussed the diagnosis and management of bladder calculi.  I advised him that cystoscopy and litholapaxy is at the usual treatment.  He does not wish to pursue any treatment at the present time as he is currently asymptomatic. Return to office in 6 weeks for cystoscopy  Chief Complaint:  Chief Complaint  Patient presents with   Bladder stones    History of Present Illness:  [name] is a 87 y.o. male who is seen in consultation from Leeanne Rio, MD for evaluation of bladder calculi, gross hematuria, and history of UTI. He was seen in the emergency room at [facility] on 10/27/2021 with dysuria, urinary incontinence, and gross hematuria.  CT imaging without IV contrast demonstrated no renal or ureteral calculi, no evidence of obstruction, at least 5 bladder stones measuring up to 17 mm in size. Urine culture grew 10-50 K. Proteus.  His symptoms resolved after antibiotic therapy.  No further episodes of gross hematuria.  No history of frequent UTIs.  He reports lower urinary tract symptoms including frequency, occasional urgency, and nocturia 2-3 times. He was started on tamsulosin approximately 1 month ago.  He feels like this has improved his urinary symptoms. IPSS = 3 today.  Past Medical History:  Past Medical History:  Diagnosis Date   CAD (coronary artery disease)    Diabetes ([place])    HTN (hypertension)    Hyperlipidemia     Past Surgical History:  Past Surgical History:  Procedure Laterality Date   APPENDECTOMY     CORONARY ARTERY BYPASS GRAFT     TOTAL KNEE ARTHROPLASTY      Allergies:  Allergies  Allergen Reactions   Lipitor [Atorvastatin]     Family  History:  History reviewed. No pertinent family history.  Social History:  Social History   Tobacco Use   Smoking status: Never   Smokeless tobacco: Never  Substance Use Topics   Alcohol use: Yes    Review of symptoms:  Constitutional:  Negative for unexplained weight loss, night sweats, fever, chills ENT:  Negative for nose bleeds, sinus pain, painful swallowing CV:  Negative for chest pain, shortness of breath, exercise intolerance, palpitations, loss of consciousness Resp:  Negative for cough, wheezing, shortness of breath GI:  Negative for nausea, vomiting, diarrhea, bloody stools GU:  Positives noted in HPI; otherwise negative Neuro:  Negative for seizures, poor balance, limb weakness, slurred speech Psych:  Negative for lack of energy, depression, anxiety Endocrine:  Negative for polydipsia, polyuria, symptoms of hypoglycemia (dizziness, hunger, sweating) Hematologic:  Negative for anemia, purpura, petechia, prolonged or excessive bleeding, use of anticoagulants  Allergic:  Negative for difficulty breathing or choking as a result of exposure to anything; no shellfish allergy; no allergic response (rash/itch) to materials, foods  Physical exam: BP (!) 151/66   Pulse 67   Ht 5\' 6"  (1.676 m)   Wt 245 lb (111.1 kg)   BMI 39.54 kg/m  GENERAL APPEARANCE:  Well appearing, well developed, well nourished, NAD HEENT: Atraumatic, Normocephalic, oropharynx clear. NECK: Supple without lymphadenopathy or thyromegaly. LUNGS: Clear to auscultation bilaterally. HEART: Regular Rate and Rhythm without murmurs, gallops, or rubs. ABDOMEN: Soft, non-tender, No Masses. EXTREMITIES: Moves  all extremities well.  Without clubbing, cyanosis, or edema. NEUROLOGIC:  Alert and oriented x 3, normal gait, CN II-XII grossly intact.  MENTAL STATUS:  Appropriate. BACK:  Non-tender to palpation.  No CVAT SKIN:  Warm, dry and intact.   GU: Penis: Redundant foreskin with glanular adhesions Meatus:  Normal Scrotum: Left scrotal enlargement?  Hydrocele Testis: Right: Normal; unable to palpate left easily Prostate: 50 g, nontender, no nodules Rectum: Normal tone,  no masses or tenderness   Results: U/A: dipstick negative  PVR =  82 ml

## 2022-02-13 ENCOUNTER — Other Ambulatory Visit: Payer: Medicare PPO | Admitting: Urology

## 2022-03-27 ENCOUNTER — Ambulatory Visit (INDEPENDENT_AMBULATORY_CARE_PROVIDER_SITE_OTHER): Payer: Medicare HMO | Admitting: Urology

## 2022-03-27 VITALS — BP 139/65 | HR 80

## 2022-03-27 DIAGNOSIS — N210 Calculus in bladder: Secondary | ICD-10-CM

## 2022-03-27 DIAGNOSIS — N138 Other obstructive and reflux uropathy: Secondary | ICD-10-CM

## 2022-03-27 DIAGNOSIS — N401 Enlarged prostate with lower urinary tract symptoms: Secondary | ICD-10-CM

## 2022-03-27 DIAGNOSIS — R351 Nocturia: Secondary | ICD-10-CM

## 2022-03-27 LAB — URINALYSIS, ROUTINE W REFLEX MICROSCOPIC
Bilirubin, UA: NEGATIVE
Ketones, UA: NEGATIVE
Leukocytes,UA: NEGATIVE
Nitrite, UA: NEGATIVE
Protein,UA: NEGATIVE
RBC, UA: NEGATIVE
Specific Gravity, UA: 1.015 (ref 1.005–1.030)
Urobilinogen, Ur: 0.2 mg/dL (ref 0.2–1.0)
pH, UA: 5.5 (ref 5.0–7.5)

## 2022-03-27 MED ORDER — TAMSULOSIN HCL 0.4 MG PO CAPS: 0.4000 mg | ORAL_CAPSULE | Freq: Every day | ORAL | 3 refills | Status: DC

## 2022-03-27 NOTE — Progress Notes (Signed)
03/27/2022 9:29 AM   [name] 02-11-1935 [id number]  Referring provider: Leeanne Rio, MD [place],  [place] [address]  Followup BPh and bladder calculi   HPI: [name] is a 88yo here for followup for BPH and bladder calculi. He was hospitalized for CHF and [facility] and passed a calculus while hospitalized. He is currently on floma x0.4mg  daily. Urine stream fair. He has nocturia 3-4x.    PMH: Past Medical History:  Diagnosis Date   CAD (coronary artery disease)    Diabetes ([place])    HTN (hypertension)    Hyperlipidemia     Surgical History: Past Surgical History:  Procedure Laterality Date   APPENDECTOMY     CORONARY ARTERY BYPASS GRAFT     TOTAL KNEE ARTHROPLASTY      Home Medications:  Allergies as of 03/27/2022       Reactions   Lipitor [atorvastatin]         Medication List        Accurate as of March 27, 2022  9:29 AM. If you have any questions, ask your nurse or doctor.          aspirin EC 81 MG tablet Take by mouth.   atorvastatin 40 MG tablet Commonly known as: LIPITOR atorvastatin 40 mg tablet   clotrimazole-betamethasone cream Commonly known as: LOTRISONE clotrimazole-betamethasone 1 %-0.05 % topical cream   diclofenac 75 MG EC tablet Commonly known as: VOLTAREN TAKE 1 TABLET BY MOUTH TWICE DAILY WITH FOOD AS NEEDED FOR SHOULDER PAIN   diclofenac sodium 1 % Gel Commonly known as: VOLTAREN APPLY 2 GRAMS TOPICALLY 4 TIMES DAILY   doxycycline 100 MG tablet Commonly known as: VIBRA-TABS Take 100 mg by mouth 2 (two) times daily. for 7 days   Eliquis 5 MG Tabs tablet Generic drug: apixaban Take 5 mg by mouth.   flurbiprofen 0.03 % ophthalmic solution Commonly known as: OCUFEN   furosemide 20 MG tablet Commonly known as: LASIX furosemide 20 mg tablet   ketorolac 0.5 % ophthalmic solution Commonly known as: ACULAR   Klor-Con M20 20 MEQ tablet Generic drug: potassium chloride SA Klor-Con M20 mEq  tablet,extended release   potassium chloride SA 20 MEQ tablet Commonly known as: KLOR-CON M   Lantus 100 UNIT/ML injection Generic drug: insulin glargine Inject into the skin.   metFORMIN 1000 MG tablet Commonly known as: GLUCOPHAGE metformin 1,000 mg tablet   metFORMIN 500 MG 24 hr tablet Commonly known as: GLUCOPHAGE-XR Take by mouth.   metoprolol tartrate 25 MG tablet Commonly known as: LOPRESSOR metoprolol tartrate 25 mg tablet   metroNIDAZOLE 500 MG tablet Commonly known as: FLAGYL metronidazole 500 mg tablet   NovoLIN N 100 UNIT/ML injection Generic drug: insulin NPH Human INJECT 28 UNITS SUBCUTANEOUSLY EVERY MORNING BEFORE BREAKFAST AND 15 UNITS EVERY EVENING AFTER SUPPER   NovoLIN N ReliOn 100 UNIT/ML injection Generic drug: insulin NPH Human INJECT 28 UNITS SUBCUTANEOUSLY EVERY MORNING BEFORE BREAKFAST AND INJECT 15 UNITS EVERY EVENING AFTER SUPPER   ofloxacin 0.3 % ophthalmic solution Commonly known as: OCUFLOX ofloxacin 0.3 % eye drops   omeprazole 40 MG capsule Commonly known as: PRILOSEC   pantoprazole 20 MG tablet Commonly known as: PROTONIX Take by mouth.   predniSONE 20 MG tablet Commonly known as: DELTASONE TAKE 2 TABLETS BY MOUTH ONCE DAILY FOR 3 DAYS   ramipril 5 MG capsule Commonly known as: ALTACE ramipril 5 mg capsule   ranitidine 300 MG tablet Commonly known as: ZANTAC ranitidine 300  mg tablet   simvastatin 40 MG tablet Commonly known as: ZOCOR simvastatin 40 mg tablet   Ventolin HFA 108 (90 Base) MCG/ACT inhaler Generic drug: albuterol Inhale into the lungs.        Allergies:  Allergies  Allergen Reactions   Lipitor [Atorvastatin]     Family History: No family history on file.  Social History:  reports that he has never smoked. He has never used smokeless tobacco. He reports current alcohol use. No history on file for drug use.  ROS: All other review of systems were reviewed and are negative except what is noted  above in HPI  Physical Exam: BP 139/65   Pulse 80   Constitutional:  Alert and oriented, No acute distress. HEENT: [place] AT, moist mucus membranes.  Trachea midline, no masses. Cardiovascular: No clubbing, cyanosis, or edema. Respiratory: Normal respiratory effort, no increased work of breathing. GI: Abdomen is soft, nontender, nondistended, no abdominal masses GU: No CVA tenderness.  Lymph: No cervical or inguinal lymphadenopathy. Skin: No rashes, bruises or suspicious lesions. Neurologic: Grossly intact, no focal deficits, moving all 4 extremities. Psychiatric: Normal mood and affect.  Laboratory Data: Lab Results  Component Value Date   WBC 8.0 05/09/2008   HGB 12.4 (L) 05/09/2008   HCT 37.3 (L) 05/09/2008   MCV 89.4 05/09/2008   PLT 193 05/09/2008    Lab Results  Component Value Date   CREATININE 0.73 05/09/2008    No results found for: "PSA"  No results found for: "TESTOSTERONE"  No results found for: "HGBA1C"  Urinalysis    Component Value Date/Time   COLORURINE YELLOW 03/01/2008 1131   APPEARANCEUR Clear 01/04/2022 1225   LABSPEC 1.014 03/01/2008 1131   PHURINE 5.5 03/01/2008 1131   GLUCOSEU Negative 01/04/2022 1225   HGBUR NEGATIVE 03/01/2008 1131   BILIRUBINUR Negative 01/04/2022 1225   KETONESUR NEGATIVE 03/01/2008 1131   PROTEINUR Negative 01/04/2022 1225   PROTEINUR NEGATIVE 03/01/2008 1131   UROBILINOGEN 0.2 03/01/2008 1131   NITRITE Negative 01/04/2022 1225   NITRITE NEGATIVE 03/01/2008 1131   LEUKOCYTESUR Negative 01/04/2022 1225    Lab Results  Component Value Date   LABMICR Comment 01/04/2022    Pertinent Imaging:  No results found for this or any previous visit.  No results found for this or any previous visit.  No results found for this or any previous visit.  No results found for this or any previous visit.  No results found for this or any previous visit.  No valid procedures specified. No results found for this or any  previous visit.  No results found for this or any previous visit.   Assessment & Plan:    1. Bladder calculi -patient defers therapy at this time - Urinalysis, Routine w reflex microscopic  2. Benign prostatic hyperplasia with urinary obstruction -flomax 0.4mg  daily  3. Nocturia -flomax 0.4mg  daily   No follow-ups on file.  Nicolette Bang, MD  [facility] [place]

## 2022-04-02 ENCOUNTER — Encounter: Payer: Self-pay | Admitting: Urology

## 2022-09-24 ENCOUNTER — Other Ambulatory Visit: Payer: Self-pay | Admitting: Family Medicine

## 2022-09-24 DIAGNOSIS — M545 Low back pain, unspecified: Secondary | ICD-10-CM

## 2022-09-25 ENCOUNTER — Ambulatory Visit: Payer: Medicare HMO | Admitting: Urology

## 2022-09-25 VITALS — BP 113/62 | HR 69

## 2022-09-25 DIAGNOSIS — R351 Nocturia: Secondary | ICD-10-CM | POA: Diagnosis not present

## 2022-09-25 DIAGNOSIS — N210 Calculus in bladder: Secondary | ICD-10-CM | POA: Diagnosis not present

## 2022-09-25 DIAGNOSIS — N138 Other obstructive and reflux uropathy: Secondary | ICD-10-CM

## 2022-09-25 DIAGNOSIS — N401 Enlarged prostate with lower urinary tract symptoms: Secondary | ICD-10-CM

## 2022-09-25 LAB — URINALYSIS, ROUTINE W REFLEX MICROSCOPIC
Bilirubin, UA: NEGATIVE
Ketones, UA: NEGATIVE
Leukocytes,UA: NEGATIVE
Nitrite, UA: NEGATIVE
Protein,UA: NEGATIVE
RBC, UA: NEGATIVE
Specific Gravity, UA: 1.02 (ref 1.005–1.030)
Urobilinogen, Ur: 0.2 mg/dL (ref 0.2–1.0)
pH, UA: 5.5 (ref 5.0–7.5)

## 2022-09-25 MED ORDER — TAMSULOSIN HCL 0.4 MG PO CAPS: 0.4000 mg | ORAL_CAPSULE | Freq: Every day | ORAL | 3 refills | Status: DC

## 2022-09-25 NOTE — Progress Notes (Signed)
post void residual=10 

## 2022-09-25 NOTE — Progress Notes (Signed)
09/25/2022 11:03 AM   [name] 30-May-1934 [id number]  Referring provider: Suzan Slick, MD [address] [place],  [place] [address]  Followup BPH  HPI: [name] is a 87yo here followup for BPH with nocturia. Nocturia 3x on flomax 0.4mg  daily. IPSS 3 QOl 1 on flomax. No gross hematuria. He has not passed any bladder calculi since last visit. No straining to urinate. NO other complaints today   PMH: Past Medical History:  Diagnosis Date   CAD (coronary artery disease)    Diabetes (HCC)    HTN (hypertension)    Hyperlipidemia     Surgical History: Past Surgical History:  Procedure Laterality Date   APPENDECTOMY     CORONARY ARTERY BYPASS GRAFT     TOTAL KNEE ARTHROPLASTY      Home Medications:  Allergies as of 09/25/2022       Reactions   Lipitor [atorvastatin]         Medication List        Accurate as of September 25, 2022 11:03 AM. If you have any questions, ask your nurse or doctor.          aspirin EC 81 MG tablet Take by mouth.   atorvastatin 40 MG tablet Commonly known as: LIPITOR atorvastatin 40 mg tablet   clotrimazole-betamethasone cream Commonly known as: LOTRISONE clotrimazole-betamethasone 1 %-0.05 % topical cream   diclofenac 75 MG EC tablet Commonly known as: VOLTAREN TAKE 1 TABLET BY MOUTH TWICE DAILY WITH FOOD AS NEEDED FOR SHOULDER PAIN   diclofenac sodium 1 % Gel Commonly known as: VOLTAREN APPLY 2 GRAMS TOPICALLY 4 TIMES DAILY   doxycycline 100 MG tablet Commonly known as: VIBRA-TABS Take 100 mg by mouth 2 (two) times daily. for 7 days   Eliquis 5 MG Tabs tablet Generic drug: apixaban Take 5 mg by mouth.   flurbiprofen 0.03 % ophthalmic solution Commonly known as: OCUFEN   furosemide 20 MG tablet Commonly known as: LASIX furosemide 20 mg tablet   ketorolac 0.5 % ophthalmic solution Commonly known as: ACULAR   Klor-Con M20 20 MEQ tablet Generic drug: potassium chloride SA Klor-Con M20 mEq tablet,extended  release   potassium chloride SA 20 MEQ tablet Commonly known as: KLOR-CON M   Lantus 100 UNIT/ML injection Generic drug: insulin glargine Inject into the skin.   metFORMIN 1000 MG tablet Commonly known as: GLUCOPHAGE metformin 1,000 mg tablet   metFORMIN 500 MG 24 hr tablet Commonly known as: GLUCOPHAGE-XR Take by mouth.   metoprolol tartrate 25 MG tablet Commonly known as: LOPRESSOR metoprolol tartrate 25 mg tablet   metroNIDAZOLE 500 MG tablet Commonly known as: FLAGYL metronidazole 500 mg tablet   NovoLIN N 100 UNIT/ML injection Generic drug: insulin NPH Human INJECT 28 UNITS SUBCUTANEOUSLY EVERY MORNING BEFORE BREAKFAST AND 15 UNITS EVERY EVENING AFTER SUPPER   NovoLIN N ReliOn 100 UNIT/ML injection Generic drug: insulin NPH Human INJECT 28 UNITS SUBCUTANEOUSLY EVERY MORNING BEFORE BREAKFAST AND INJECT 15 UNITS EVERY EVENING AFTER SUPPER   ofloxacin 0.3 % ophthalmic solution Commonly known as: OCUFLOX ofloxacin 0.3 % eye drops   omeprazole 40 MG capsule Commonly known as: PRILOSEC   pantoprazole 20 MG tablet Commonly known as: PROTONIX Take by mouth.   predniSONE 20 MG tablet Commonly known as: DELTASONE TAKE 2 TABLETS BY MOUTH ONCE DAILY FOR 3 DAYS   ramipril 5 MG capsule Commonly known as: ALTACE ramipril 5 mg capsule   ranitidine 300 MG tablet Commonly known as: ZANTAC ranitidine 300 mg tablet  simvastatin 40 MG tablet Commonly known as: ZOCOR simvastatin 40 mg tablet   tamsulosin 0.4 MG Caps capsule Commonly known as: FLOMAX Take 1 capsule (0.4 mg total) by mouth daily after supper.   Ventolin HFA 108 (90 Base) MCG/ACT inhaler Generic drug: albuterol Inhale into the lungs.        Allergies:  Allergies  Allergen Reactions   Lipitor [Atorvastatin]     Family History: No family history on file.  Social History:  reports that he has never smoked. He has never used smokeless tobacco. He reports current alcohol use. No history on  file for drug use.  ROS: All other review of systems were reviewed and are negative except what is noted above in HPI  Physical Exam: BP 113/62   Pulse 69   Constitutional:  Alert and oriented, No acute distress. HEENT: [place] AT, moist mucus membranes.  Trachea midline, no masses. Cardiovascular: No clubbing, cyanosis, or edema. Respiratory: Normal respiratory effort, no increased work of breathing. GI: Abdomen is soft, nontender, nondistended, no abdominal masses GU: No CVA tenderness.  Lymph: No cervical or inguinal lymphadenopathy. Skin: No rashes, bruises or suspicious lesions. Neurologic: Grossly intact, no focal deficits, moving all 4 extremities. Psychiatric: Normal mood and affect.  Laboratory Data: Lab Results  Component Value Date   WBC 8.0 05/09/2008   HGB 12.4 (L) 05/09/2008   HCT 37.3 (L) 05/09/2008   MCV 89.4 05/09/2008   PLT 193 05/09/2008    Lab Results  Component Value Date   CREATININE 0.73 05/09/2008    No results found for: "PSA"  No results found for: "TESTOSTERONE"  No results found for: "HGBA1C"  Urinalysis    Component Value Date/Time   COLORURINE YELLOW 03/01/2008 1131   APPEARANCEUR Clear 03/27/2022 0910   LABSPEC 1.014 03/01/2008 1131   PHURINE 5.5 03/01/2008 1131   GLUCOSEU 3+ (A) 03/27/2022 0910   HGBUR NEGATIVE 03/01/2008 1131   BILIRUBINUR Negative 03/27/2022 0910   KETONESUR NEGATIVE 03/01/2008 1131   PROTEINUR Negative 03/27/2022 0910   PROTEINUR NEGATIVE 03/01/2008 1131   UROBILINOGEN 0.2 03/01/2008 1131   NITRITE Negative 03/27/2022 0910   NITRITE NEGATIVE 03/01/2008 1131   LEUKOCYTESUR Negative 03/27/2022 0910    Lab Results  Component Value Date   LABMICR Comment 03/27/2022    Pertinent Imaging:  No results found for this or any previous visit.  No results found for this or any previous visit.  No results found for this or any previous visit.  No results found for this or any previous visit.  No results found  for this or any previous visit.  No valid procedures specified. No results found for this or any previous visit.  No results found for this or any previous visit.   Assessment & Plan:    1. Bladder calculi -continue observation - Urinalysis, Routine w reflex microscopic - BLADDER SCAN AMB NON-IMAGING  2. Benign prostatic hyperplasia with urinary obstruction Continue flomax 0.4mg  daily  3. Nocturia Continue flomax 0.4mg  daily   No follow-ups on file.  Wilkie Aye, MD  [facility] [place]

## 2022-09-30 NOTE — Discharge Instructions (Signed)

## 2022-10-01 ENCOUNTER — Ambulatory Visit
Admission: RE | Admit: 2022-10-01 | Discharge: 2022-10-01 | Disposition: A | Discharge status: Home / Self Care | Payer: Medicare HMO | Source: Ambulatory Visit | Attending: Family Medicine | Admitting: Family Medicine

## 2022-10-01 ENCOUNTER — Encounter: Payer: Self-pay | Admitting: Urology

## 2022-10-01 DIAGNOSIS — M545 Low back pain, unspecified: Secondary | ICD-10-CM

## 2022-10-01 MED ORDER — MEPERIDINE HCL 50 MG/ML IJ SOLN: 50.0000 mg | Freq: Once | INTRAMUSCULAR | Status: DC | PRN

## 2022-10-01 MED ORDER — ONDANSETRON HCL 4 MG/2ML IJ SOLN: 4.0000 mg | Freq: Once | INTRAMUSCULAR | Status: DC | PRN

## 2022-10-01 MED ORDER — DIAZEPAM 5 MG PO TABS: 5.0000 mg | ORAL_TABLET | Freq: Once | ORAL | Status: DC

## 2022-10-01 MED ORDER — IOPAMIDOL (ISOVUE-M 200) INJECTION 41%
20.0000 mL | Freq: Once | INTRAMUSCULAR | Status: AC
  Administered 2022-10-01: 20 mL via INTRATHECAL

## 2022-10-01 NOTE — Patient Instructions (Signed)

## 2023-09-26 ENCOUNTER — Ambulatory Visit: Payer: Medicare HMO | Admitting: Urology

## 2023-11-24 ENCOUNTER — Other Ambulatory Visit: Payer: Self-pay | Admitting: Urology

## 2024-03-08 ENCOUNTER — Other Ambulatory Visit: Payer: Self-pay | Admitting: Urology
